# Patient Record
Sex: Male | Born: 1980 | Race: White | Hispanic: No | Marital: Married | Smoking: Never smoker
Health system: Southern US, Community
[De-identification: ages and names within clinical notes are randomized; demographics above are authoritative.]

## PROBLEM LIST (undated history)

## (undated) DIAGNOSIS — F4310 Post-traumatic stress disorder, unspecified: Secondary | ICD-10-CM

---

## 2020-06-24 ENCOUNTER — Emergency Department: Payer: No Typology Code available for payment source

## 2020-06-24 ENCOUNTER — Encounter: Payer: Self-pay | Admitting: Emergency Medicine

## 2020-06-24 ENCOUNTER — Other Ambulatory Visit: Payer: Self-pay

## 2020-06-24 ENCOUNTER — Emergency Department
Admission: EM | Admit: 2020-06-24 | Discharge: 2020-06-24 | Disposition: A | Discharge status: Home / Self Care | Payer: No Typology Code available for payment source | Attending: Emergency Medicine | Admitting: Emergency Medicine

## 2020-06-24 DIAGNOSIS — R509 Fever, unspecified: Secondary | ICD-10-CM | POA: Diagnosis present

## 2020-06-24 DIAGNOSIS — U071 COVID-19: Secondary | ICD-10-CM | POA: Insufficient documentation

## 2020-06-24 HISTORY — DX: Post-traumatic stress disorder, unspecified: F43.10

## 2020-06-24 LAB — RESP PANEL BY RT-PCR (FLU A&B, COVID) ARPGX2
Influenza A by PCR: NEGATIVE
Influenza B by PCR: NEGATIVE
SARS Coronavirus 2 by RT PCR: POSITIVE — AB

## 2020-06-24 MED ORDER — BENZONATATE 100 MG PO CAPS: 100.0000 mg | ORAL_CAPSULE | Freq: Three times a day (TID) | ORAL | 0 refills | Status: AC | PRN

## 2020-06-24 NOTE — Discharge Instructions (Addendum)
You have tested positive for COVID-19.  Please take alternate Tylenol and Motrin for any fevers.  You can continue taking TheraFlu.  I have also prescribed you Tessalon Perles for your cough.  Be sure to stay plenty hydrated.  Return to the emergency department if your symptoms are worsening, including shortness of breath, chest pain, any symptoms that are concerning to you.

## 2020-06-24 NOTE — ED Provider Notes (Signed)
[facility] Emergency Department Provider Note  ____________________________________________  Time seen: Approximately 1:42 PM  I have reviewed the triage vital signs and the nursing notes.   HISTORY  Chief Complaint Cough and Chills    HPI Michael Walsh is a 39 y.o. male that presents to the emergency department for evaluation of fever, chills, sore throat, nonproductive cough for 4 days.  He states that everyone at Thanksgiving was sick with cough and congestion.  He thinks his granddaughter gave it to him.  He has not had a Covid vaccination.  Patient is staying at the [facility] and walked to the hospital from the [facility].  No shortness of breath, chest pain, vomiting, diarrhea.   Past Medical History:  Diagnosis Date  . PTSD (post-traumatic stress disorder)     There are no problems to display for this patient.   History reviewed. No pertinent surgical history.  Prior to Admission medications   Medication Sig Start Date End Date Taking? Authorizing Provider  benzonatate (TESSALON PERLES) 100 MG capsule Take 1 capsule (100 mg total) by mouth 3 (three) times daily as needed. 06/24/20 06/24/21  Enid Derry, PA-C    Allergies Patient has no known allergies.  History reviewed. No pertinent family history.  Social History Social History   Tobacco Use  . Smoking status: Never Smoker  . Smokeless tobacco: Never Used  Vaping Use  . Vaping Use: Every day  Substance Use Topics  . Alcohol use: Not Currently  . Drug use: Not on file     Review of Systems  Constitutional: Positive for fever/chills Eyes: No visual changes. No discharge. ENT: Positive for congestion and rhinorrhea. Cardiovascular: No chest pain. Respiratory: Positive for cough. No SOB. Gastrointestinal: No abdominal pain.  No nausea, no vomiting.  No diarrhea.  No constipation. Musculoskeletal: Negative for musculoskeletal pain. Skin: Negative for rash, abrasions,  lacerations, ecchymosis. Neurological: Negative for headaches.   ____________________________________________   PHYSICAL EXAM:  VITAL SIGNS: ED Triage Vitals  Enc Vitals Group     BP 06/24/20 1117 128/83     Pulse Rate 06/24/20 1117 93     Resp 06/24/20 1117 18     Temp 06/24/20 1117 98.6 F (37 C)     Temp Source 06/24/20 1117 Oral     SpO2 06/24/20 1117 95 %     Weight 06/24/20 1121 220 lb (99.8 kg)     Height 06/24/20 1121 6' (1.829 m)     Head Circumference --      Peak Flow --      Pain Score 06/24/20 1048 6     Pain Loc --      Pain Edu? --      Excl. in GC? --      Constitutional: Alert and oriented. Well appearing and in no acute distress. Eyes: Conjunctivae are normal. PERRL. EOMI. No discharge. Head: Atraumatic. ENT: No frontal and maxillary sinus tenderness.      Ears: Tympanic membranes pearly gray with good landmarks. No discharge.      Nose: Mild congestion/rhinnorhea.      Mouth/Throat: Mucous membranes are moist. Oropharynx non-erythematous. Tonsils not enlarged. No exudates. Uvula midline. Neck: No stridor.   Hematological/Lymphatic/Immunilogical: No cervical lymphadenopathy. Cardiovascular: Normal rate, regular rhythm.  Good peripheral circulation. Respiratory: Normal respiratory effort without tachypnea or retractions. Lungs CTAB. Good air entry to the bases with no decreased or absent breath sounds. Gastrointestinal: Bowel sounds 4 quadrants. Soft and nontender to palpation. No guarding or  rigidity. No palpable masses. No distention. Musculoskeletal: Full range of motion to all extremities. No gross deformities appreciated. Neurologic:  Normal speech and language. No gross focal neurologic deficits are appreciated.  Skin:  Skin is warm, dry and intact. No rash noted. Psychiatric: Mood and affect are normal. Speech and behavior are normal. Patient exhibits appropriate insight and judgement.   ____________________________________________    LABS (all labs ordered are listed, but only abnormal results are displayed)  Labs Reviewed  RESP PANEL BY RT-PCR (FLU A&B, COVID) ARPGX2 - Abnormal; Notable for the following components:      Result Value   SARS Coronavirus 2 by RT PCR POSITIVE (*)    All other components within normal limits   ____________________________________________  EKG   ____________________________________________  RADIOLOGY Lexine Baton, personally viewed and evaluated these images (plain radiographs) as part of my medical decision making, as well as reviewing the written report by the radiologist.  DG Chest 1 View  Result Date: 06/24/2020 CLINICAL DATA:  Cough and fever. EXAM: CHEST  1 VIEW COMPARISON:  No prior. FINDINGS: Low lung volumes with bibasilar atelectasis. No focal infiltrate. No pleural effusion or pneumothorax. Heart size normal. Degenerative change thoracic spine. IMPRESSION: Low lung volumes with bibasilar atelectasis. Electronically Signed   By: Maisie Fus  Register   On: 06/24/2020 12:27    ____________________________________________    PROCEDURES  Procedure(s) performed:    Procedures    Medications - No data to display   ____________________________________________   INITIAL IMPRESSION / ASSESSMENT AND PLAN / ED COURSE  Pertinent labs & imaging results that were available during my care of the patient were reviewed by me and considered in my medical decision making (see chart for details).  Review of the [place] CSRS was performed in accordance of the [facility] prior to dispensing any controlled drugs.   Patient's diagnosis is consistent with COVID-19. Vital signs and exam are reassuring.  COVID-19 test is positive.  Chest x-ray shows low lung volumes with bibasilar atelectasis and no focal infiltarte. He denies and SOB or CP. Patient appears well and is staying well hydrated. Patient should alternate tylenol and ibuprofen for fever. Patient feels comfortable going home.   Patient is to follow up with primary care as needed or otherwise directed. Patient is given ED precautions to return to the ED for any worsening or new symptoms.   Michael Walsh was evaluated in Emergency Department on 06/24/2020 for the symptoms described in the history of present illness. He was evaluated in the context of the global COVID-19 pandemic, which necessitated consideration that the patient might be at risk for infection with the SARS-CoV-2 virus that causes COVID-19. Institutional protocols and algorithms that pertain to the evaluation of patients at risk for COVID-19 are in a state of rapid change based on information released by regulatory bodies including the CDC and federal and state organizations. These policies and algorithms were followed during the patient's care in the ED.  ____________________________________________  FINAL CLINICAL IMPRESSION(S) / ED DIAGNOSES  Final diagnoses:  COVID-19      NEW MEDICATIONS STARTED DURING THIS VISIT:  ED Discharge Orders         Ordered    benzonatate (TESSALON PERLES) 100 MG capsule  3 times daily PRN        06/24/20 1432              This chart was dictated using voice recognition software/Dragon. Despite best efforts to proofread, errors can occur which can change  the meaning. Any change was purely unintentional.    Enid Derry, PA-C 06/24/20 1444    Jene Every, MD 06/24/20 [id number]

## 2020-06-24 NOTE — ED Triage Notes (Signed)
Pt comes into the ED via POV c/o cough, fever, and chills.  Pt currently has even and unlabored respirations.  Pt denies coming in contact with anyone that is COVID positive that he is aware of.  Pt denies any N/V/D.

## 2020-06-25 ENCOUNTER — Telehealth: Payer: Self-pay | Admitting: Nurse Practitioner

## 2020-06-25 DIAGNOSIS — U071 COVID-19: Secondary | ICD-10-CM

## 2020-06-25 NOTE — Telephone Encounter (Signed)
Called to discuss with Baron Sane about Covid symptoms and the use of a monoclonal antibody infusion for those with mild to moderate Covid symptoms and at a high risk of hospitalization.     Pt is qualified for this infusion at the [facility] center due to co-morbid conditions and/or a member of an at-risk group, however declines infusion at this time. Symptoms tier reviewed as well as criteria for ending isolation.  Symptoms reviewed that would warrant ED/Hospital evaluation. Preventative practices reviewed. Patient verbalized understanding. Patient advised to call back if he/she opts to proceed with infusion. Callback number provided. Urgent care and/or ER precautions given for severe symptoms.  There are no problems to display for this patient.    Consuello Masse, NP [phone] [email or web address]

## 2021-07-17 IMAGING — DX DG CHEST 1V
1 series · 1 of 1 positions shown · non-contrast
Comparison: No prior.

CLINICAL DATA: Cough and fever.

EXAM:
CHEST  1 VIEW

[chest ap]
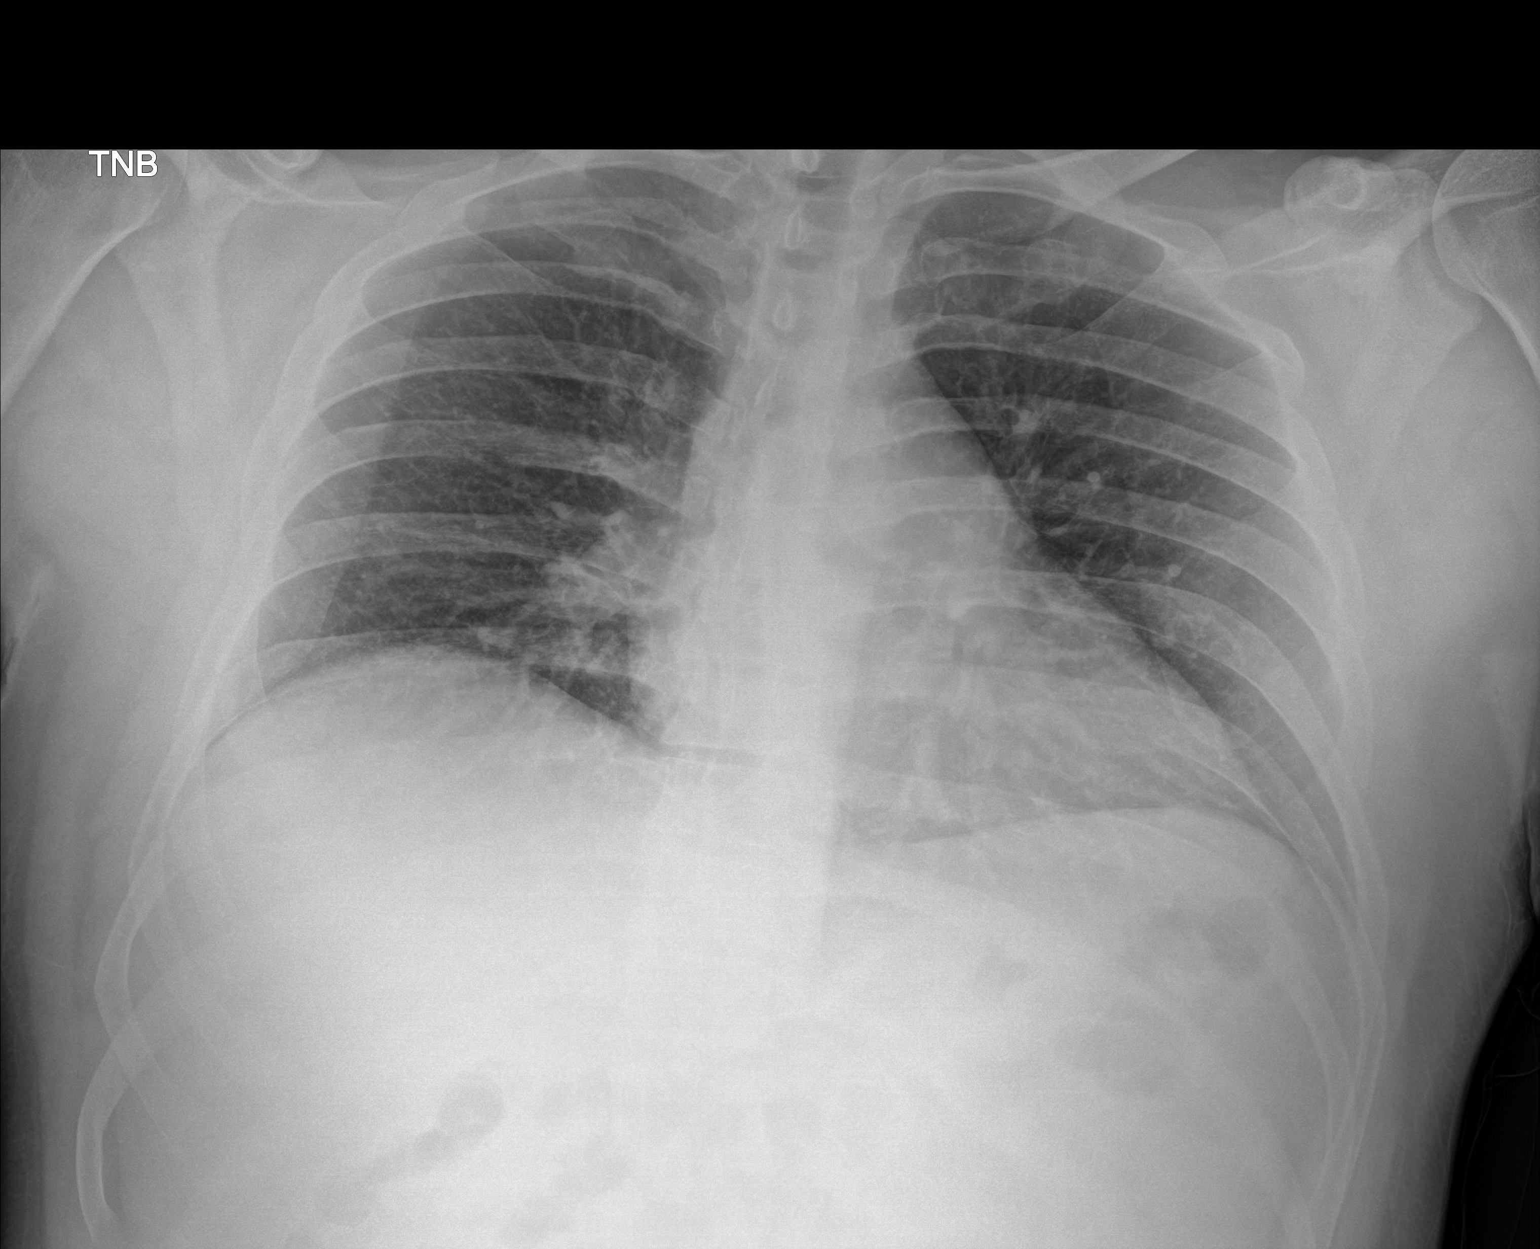

[1 of 1 positions shown; findings below may reference images not displayed]

FINDINGS: Low lung volumes with bibasilar atelectasis. No focal infiltrate. No
pleural effusion or pneumothorax. Heart size normal. Degenerative
change thoracic spine.
IMPRESSION: Low lung volumes with bibasilar atelectasis.
